# Patient Record
Sex: Male | Born: 2005 | Race: White | Hispanic: No | Marital: Single | Smoking: Never smoker
Health system: Southern US, Community
[De-identification: ages and names within clinical notes are randomized; demographics above are authoritative.]

## PROBLEM LIST (undated history)

## (undated) DIAGNOSIS — M4310 Spondylolisthesis, site unspecified: Secondary | ICD-10-CM

## (undated) DIAGNOSIS — F32A Depression, unspecified: Secondary | ICD-10-CM

## (undated) DIAGNOSIS — R55 Syncope and collapse: Secondary | ICD-10-CM

## (undated) DIAGNOSIS — R12 Heartburn: Secondary | ICD-10-CM

## (undated) DIAGNOSIS — Q796 Ehlers-Danlos syndrome, unspecified: Secondary | ICD-10-CM

## (undated) DIAGNOSIS — K219 Gastro-esophageal reflux disease without esophagitis: Secondary | ICD-10-CM

## (undated) DIAGNOSIS — K589 Irritable bowel syndrome without diarrhea: Secondary | ICD-10-CM

## (undated) DIAGNOSIS — Q233 Congenital mitral insufficiency: Secondary | ICD-10-CM

## (undated) HISTORY — DX: Gastro-esophageal reflux disease without esophagitis: K21.9

## (undated) HISTORY — DX: Heartburn: R12

## (undated) HISTORY — DX: Syncope and collapse: R55

## (undated) HISTORY — DX: Congenital mitral insufficiency: Q23.3

## (undated) HISTORY — DX: Depression, unspecified: F32.A

## (undated) HISTORY — DX: Irritable bowel syndrome, unspecified: K58.9

## (undated) HISTORY — PX: TRANSTHORACIC ECHOCARDIOGRAM: SHX275

## (undated) HISTORY — DX: Ehlers-Danlos syndrome, unspecified: Q79.60

## (undated) HISTORY — DX: Spondylolisthesis, site unspecified: M43.10

---
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
## 2023-02-04 ENCOUNTER — Encounter: Payer: Self-pay | Admitting: Family Medicine

## 2023-02-05 ENCOUNTER — Encounter: Payer: Self-pay | Admitting: Family Medicine

## 2023-02-05 ENCOUNTER — Ambulatory Visit: Payer: Self-pay | Admitting: Family Medicine

## 2023-02-05 VITALS — BP 98/78 | HR 72 | Ht 72.5 in | Wt 164.6 lb

## 2023-02-05 DIAGNOSIS — Q7961 Classical Ehlers-Danlos syndrome: Secondary | ICD-10-CM

## 2023-02-05 DIAGNOSIS — Z0000 Encounter for general adult medical examination without abnormal findings: Secondary | ICD-10-CM | POA: Diagnosis not present

## 2023-02-05 DIAGNOSIS — Z23 Encounter for immunization: Secondary | ICD-10-CM | POA: Diagnosis not present

## 2023-02-05 DIAGNOSIS — I340 Nonrheumatic mitral (valve) insufficiency: Secondary | ICD-10-CM

## 2023-02-05 NOTE — Progress Notes (Signed)
Office Note 02/05/2023  CC:  Chief Complaint  Patient presents with   Establish Care    No further questions/concerns.     HPI:  Ralph Contreras is a 17 y.o.  male who is here to establish care, health maintenance exam. Patient's most recent primary MD: [facility] records were reviewed prior to or during today's visit.  Ralph Contreras has no acute concerns.  Past Medical History:  Diagnosis Date   Congenital mitral regurgitation    Depression    Ehlers-Danlos disease    GERD (gastroesophageal reflux disease)    Irritable bowel syndrome (IBS)    w/constip   Syncope and collapse    2020    Past Surgical History:  Procedure Laterality Date   ESOPHAGOGASTRODUODENOSCOPY  09/10/2018   nl   TRANSTHORACIC ECHOCARDIOGRAM     10/2019 mild MR, stable    Family History  Problem Relation Age of Onset   Arthritis Mother    Miscarriages / [place] Mother    Hypertension Maternal Grandmother    Hyperlipidemia Maternal Grandmother    Stroke Maternal Grandmother    Arthritis Maternal Grandmother    Cancer Maternal Grandmother    Miscarriages / Stillbirths Maternal Grandmother    Hypertension Maternal Grandfather    Hyperlipidemia Maternal Grandfather    Arthritis Maternal Grandfather    Heart attack Maternal Grandfather    Hypertension Paternal Grandmother    Cancer Paternal Grandmother    Hypertension Paternal Grandfather    Heart attack Paternal Grandfather    Early death Paternal Grandfather     Social History   Socioeconomic History   Marital status: Single    Spouse name: Not on file   Number of children: Not on file   Years of education: Not on file   Highest education level: Not on file  Occupational History   Not on file  Tobacco Use   Smoking status: Never   Smokeless tobacco: Not on file  Substance and Sexual Activity   Alcohol use: No   Drug use: No   Sexual activity: Never  Other Topics Concern   Not on file  Social  History Narrative   As of 2024 he is attending [employer] is a business major.   Occup: Sales executive   No Tob/alc/drugs   Social Determinants of Corporate investment banker Strain: Not on file  Food Insecurity: Not on file  Transportation Needs: Not on file  Physical Activity: Not on file  Stress: Not on file  Social Connections: Unknown (08/19/2021)   [facility]   Social Network    Social Network: Not on file  Intimate Partner Violence: Unknown (07/11/2021)   Received from [facility]   HITS    Physically Hurt: Not on file    Insult or Talk Down To: Not on file    Threaten Physical Harm: Not on file    Scream or Curse: Not on file    Outpatient Encounter Medications as of 02/05/2023  Medication Sig   PANTOPRAZOLE SODIUM PO Take by mouth.   Pediatric Multivit-Minerals-C (CHILDRENS CHEW VIT/MINERALS PO) Take 1 tablet by mouth daily.     Probiotic Product (PROBIOTIC DAILY PO) Take by mouth.   [DISCONTINUED] Polyethylene Glycol 3350 ([employer] PURELAX PO) Take by mouth.   No facility-administered encounter medications on file as of 02/05/2023.    Allergies  Allergen Reactions   Bee Pollen Anaphylaxis   Chapstick Swelling   Other     Bee wax  Pineapple     Review of Systems  Constitutional:  Negative for appetite change, chills, fatigue and fever.  HENT:  Negative for congestion, dental problem, ear pain and sore throat.   Eyes:  Negative for discharge, redness and visual disturbance.  Respiratory:  Negative for cough, chest tightness, shortness of breath and wheezing.   Cardiovascular:  Negative for chest pain, palpitations and leg swelling.  Gastrointestinal:  Negative for abdominal pain, blood in stool, diarrhea, nausea and vomiting.  Genitourinary:  Negative for difficulty urinating, dysuria, flank pain, frequency, hematuria and urgency.  Musculoskeletal:  Positive for arthralgias (knees, low back, shoulders chronic). Negative for back pain, joint swelling,  myalgias and neck stiffness.  Skin:  Negative for pallor and rash.  Neurological:  Negative for dizziness, speech difficulty, weakness and headaches.  Hematological:  Negative for adenopathy. Does not bruise/bleed easily.  Psychiatric/Behavioral:  Negative for confusion and sleep disturbance. The patient is not nervous/anxious.     PE; Blood pressure 98/78, pulse 72, height 6' 0.5" (1.842 m), weight 164 lb 9.6 oz (74.7 kg), SpO2 96%. Body mass index is 22.02 kg/m.  Physical Exam  Gen: Alert, well appearing.  Patient is oriented to person, place, time, and situation. AFFECT: pleasant, lucid thought and speech. ENT: Ears: EACs clear, normal epithelium.  TMs with good light reflex and landmarks bilaterally.  Eyes: no injection, icteris, swelling, or exudate.  EOMI, PERRLA. Nose: no drainage or turbinate edema/swelling.  No injection or focal lesion.  Mouth: lips without lesion/swelling.  Oral mucosa pink and moist.  Dentition intact and without obvious caries or gingival swelling.  Oropharynx without erythema, exudate, or swelling.  Neck: supple/nontender.  No LAD, mass, or TM.  Carotid pulses 2+ bilaterally, without bruits. CV: RRR, no m/r/g.   LUNGS: CTA bilat, nonlabored resps, good aeration in all lung fields. ABD: soft, NT, ND, BS normal.  No hepatospenomegaly or mass.  No bruits. EXT: no clubbing, cyanosis, or edema.  Musculoskeletal: no joint swelling, erythema, warmth, or tenderness.  ROM of all joints intact. Skin -no rashes, jaundice, or pallor. He has scattered hypertrophic scars, particularly on the pretibial regions.  Pertinent labs:  Last CBC Lab Results  Component Value Date   WBC 4.6 09/26/2020   HGB 15.0 09/26/2020   HCT 44 09/26/2020   PLT 165 09/26/2020   Last lipids Lab Results  Component Value Date   CHOL 127 09/26/2020   HDL 31 (A) 09/26/2020   LDLCALC 73 09/26/2020   TRIG 118 09/26/2020   ASSESSMENT AND PLAN:   New patient, establishing care.  1.   Health maintenance exam: Reviewed age and gender appropriate health maintenance issues (prudent diet, regular exercise, health risks of tobacco and excessive alcohol, use of seatbelts, fire alarms in home, use of sunscreen).  Also reviewed age and gender appropriate health screening as well as vaccine recommendations. Vaccines: Gardisil #1 today.  He declined flu vaccine today. Labs: None today.  Consider repeat lipid and glucose screening in 2027.  #2 Carylon Perches Danlos syndrome. Stable arthralgias.  He takes Aleve about 3 to 4 days a week typically. History of mild mitral valve regurgitation.  Last echo 2021 all stable. No murmur today. No physical/exercise limitation.  An After Visit Summary was printed and given to the patient.  Return in about 1 year (around 02/05/2024) for CPE 1 yr.  Return in 2 mo for Gardisil #2.  Signed:  Santiago Bumpers, MD           02/05/2023

## 2023-02-05 NOTE — Patient Instructions (Signed)
It was nice to meet you!

## 2023-02-11 ENCOUNTER — Ambulatory Visit (INDEPENDENT_AMBULATORY_CARE_PROVIDER_SITE_OTHER): Payer: No Typology Code available for payment source | Admitting: Otolaryngology

## 2023-02-11 ENCOUNTER — Encounter (INDEPENDENT_AMBULATORY_CARE_PROVIDER_SITE_OTHER): Payer: Self-pay

## 2023-02-11 VITALS — Ht 72.0 in | Wt 165.0 lb

## 2023-02-11 DIAGNOSIS — H6123 Impacted cerumen, bilateral: Secondary | ICD-10-CM | POA: Diagnosis not present

## 2023-02-11 DIAGNOSIS — H9203 Otalgia, bilateral: Secondary | ICD-10-CM

## 2023-02-14 DIAGNOSIS — H6123 Impacted cerumen, bilateral: Secondary | ICD-10-CM | POA: Insufficient documentation

## 2023-02-14 DIAGNOSIS — H9203 Otalgia, bilateral: Secondary | ICD-10-CM | POA: Insufficient documentation

## 2023-02-14 NOTE — Progress Notes (Signed)
Patient ID: Ralph Contreras, male   DOB: 25-Mar-2006, 17 y.o.   MRN: [id number]  CC: Intermittent bilateral ear pain  HPI:  Ralph Contreras is a 17 y.o. male who presents today complaining of intermittent bilateral otalgia for the past month.  He also complains of muffled hearing and occasional sensitivity to loud noises.  He was diagnosed with left otitis media 1 week ago.  He was treated with oral antibiotic.  He has no previous ENT surgery.  He denies any otorrhea or vertigo.  Past Medical History:  Diagnosis Date   Congenital mitral regurgitation    Depression    Ehlers-Danlos disease    GERD (gastroesophageal reflux disease)    Irritable bowel syndrome (IBS)    w/constip   Syncope and collapse    2020    Past Surgical History:  Procedure Laterality Date   ESOPHAGOGASTRODUODENOSCOPY  09/10/2018   nl   TRANSTHORACIC ECHOCARDIOGRAM     10/2019 mild MR, stable    Family History  Problem Relation Age of Onset   Arthritis Mother    Miscarriages / [place] Mother    Hypertension Maternal Grandmother    Hyperlipidemia Maternal Grandmother    Stroke Maternal Grandmother    Arthritis Maternal Grandmother    Cancer Maternal Grandmother    Miscarriages / Stillbirths Maternal Grandmother    Hypertension Maternal Grandfather    Hyperlipidemia Maternal Grandfather    Arthritis Maternal Grandfather    Heart attack Maternal Grandfather    Hypertension Paternal Grandmother    Cancer Paternal Grandmother    Hypertension Paternal Grandfather    Heart attack Paternal Grandfather    Early death Paternal Grandfather     Social History:  reports that he has never smoked. He does not have any smokeless tobacco history on file. He reports that he does not drink alcohol and does not use drugs.  Allergies:  Allergies  Allergen Reactions   Bee Pollen Anaphylaxis   Chapstick Swelling   Other     Bee wax   Pineapple     Prior to Admission medications   Medication Sig Start  Date End Date Taking? Authorizing Provider  Pediatric Multivit-Minerals-C (CHILDRENS CHEW VIT/MINERALS PO) Take 1 tablet by mouth daily.     Yes [provider]  Probiotic Product (PROBIOTIC DAILY PO) Take by mouth.   Yes [provider]  PANTOPRAZOLE SODIUM PO Take by mouth. Patient not taking: Reported on 02/11/2023    [provider]   Height 6' (1.829 m), weight 165 lb (74.8 kg). Exam: General: Communicates without difficulty, well nourished, no acute distress. Head: Normocephalic, no evidence injury, no tenderness, facial buttresses intact without stepoff. Face/sinus: No tenderness to palpation and percussion. Facial movement is normal and symmetric. Eyes: PERRL, EOMI. No scleral icterus, conjunctivae clear. Neuro: CN II exam reveals vision grossly intact.  No nystagmus at any point of gaze. Ears: Auricles well formed without lesions. EAC: Bilateral cerumen impaction.  Under the operating microscope, the cerumen is carefully removed with a combination of cerumen currette, alligator forceps, and suction catheters.  After the cerumen is removed, the TMs are noted to be normal.  Nose: External evaluation reveals normal support and skin without lesions.  Dorsum is intact.  Anterior rhinoscopy reveals congested mucosa over anterior aspect of inferior turbinates and intact septum.  No purulence noted. Oral:  Oral cavity and oropharynx are intact, symmetric, without erythema or edema.  Mucosa is moist without lesions. Neck: Full range of motion without pain.  There  is no significant lymphadenopathy.  No masses palpable.  Thyroid bed within normal limits to palpation.  Parotid glands and submandibular glands equal bilaterally without mass.  Trachea is midline. Neuro:  CN 2-12 grossly intact.   Assessment: 1.  Bilateral cerumen impaction.  After the disimpaction procedure, his ear canals, tympanic membranes, and middle ear spaces are all normal. 2.  No middle ear effusion or  infection is noted today.  His recent left otitis media has resolved. 3.  Referred otalgia, possibly a result of musculoskeletal causes.  Plan: 1.  Otomicroscopy with bilateral cerumen disimpaction. 2.  The physical exam findings are reviewed with the patient. 3.  He is reassured that no infection is noted today. 4.  NSAID as needed to treat his referred otalgia. 5.  The patient is encouraged to call with with any questions or concerns.  Ralph Contreras 02/14/2023, 8:06 PM

## 2023-03-02 ENCOUNTER — Ambulatory Visit (INDEPENDENT_AMBULATORY_CARE_PROVIDER_SITE_OTHER): Payer: No Typology Code available for payment source

## 2023-03-26 ENCOUNTER — Ambulatory Visit (INDEPENDENT_AMBULATORY_CARE_PROVIDER_SITE_OTHER): Payer: No Typology Code available for payment source

## 2023-04-07 ENCOUNTER — Ambulatory Visit (INDEPENDENT_AMBULATORY_CARE_PROVIDER_SITE_OTHER): Payer: No Typology Code available for payment source

## 2023-04-07 DIAGNOSIS — Z23 Encounter for immunization: Secondary | ICD-10-CM

## 2024-02-05 ENCOUNTER — Ambulatory Visit (INDEPENDENT_AMBULATORY_CARE_PROVIDER_SITE_OTHER): Admitting: Family Medicine

## 2024-02-05 ENCOUNTER — Ambulatory Visit: Payer: Self-pay

## 2024-02-05 ENCOUNTER — Encounter: Payer: Self-pay | Admitting: Family Medicine

## 2024-02-05 VITALS — BP 102/62 | HR 84 | Temp 97.7°F | Resp 97 | Ht 72.0 in | Wt 165.8 lb

## 2024-02-05 DIAGNOSIS — R1030 Lower abdominal pain, unspecified: Secondary | ICD-10-CM

## 2024-02-05 DIAGNOSIS — M357 Hypermobility syndrome: Secondary | ICD-10-CM

## 2024-02-05 LAB — URINALYSIS, ROUTINE W REFLEX MICROSCOPIC
Bilirubin Urine: NEGATIVE
Hgb urine dipstick: NEGATIVE
Ketones, ur: NEGATIVE
Leukocytes,Ua: NEGATIVE
Nitrite: NEGATIVE
RBC / HPF: NONE SEEN (ref 0–?)
Specific Gravity, Urine: 1.015 (ref 1.000–1.030)
Total Protein, Urine: NEGATIVE
Urine Glucose: NEGATIVE
Urobilinogen, UA: 0.2 (ref 0.0–1.0)
WBC, UA: NONE SEEN (ref 0–?)
pH: 6 (ref 5.0–8.0)

## 2024-02-05 MED ORDER — DICLOFENAC SODIUM 75 MG PO TBEC: 75.0000 mg | DELAYED_RELEASE_TABLET | Freq: Two times a day (BID) | ORAL | 0 refills | Status: AC

## 2024-02-05 MED ORDER — NITROFURANTOIN MONOHYD MACRO 100 MG PO CAPS: 100.0000 mg | ORAL_CAPSULE | Freq: Two times a day (BID) | ORAL | 0 refills | Status: AC

## 2024-02-05 NOTE — Telephone Encounter (Signed)
 FYI Only or Action Required?: Action required by provider: He has his physical scheduled for Monday but wanting to be seen today for abdominal pain. Did give the option of Urgent Care but patient would like to speak to office. Requesting a call back.  Patient was last seen in primary care on 02/05/2023 by McGowen, Aleene DEL, MD.  Called Nurse Triage reporting Abdominal Pain.  Symptoms began 2 to 3 days ago.  Interventions attempted: Rest, hydration, or home remedies.  Symptoms are: unchanged.  Triage Disposition: See Physician Within 24 Hours  Patient/caregiver understands and will follow disposition?: No, wishes to speak with PCP  Reports lower abdominal pain for the last 2 to 3 days. Pain is intermittent with no other symptoms. Patient is scheduled for his physical on Monday. Asking if he can be seen today if possible. Would like a call back.  Copied from CRM #[id number]. Topic: Clinical - Red Word Triage >> Feb 05, 2024  8:41 AM Victoria A wrote: [facility] that prompted transfer to Nurse Triage: Patient is having lower abdominal pain started four days ago. 1-10 pain is 5 Reason for Disposition  [1] MODERATE pain (e.g., interferes with normal activities) AND [2] pain comes and goes (cramps) AND [3] present > 24 hours  (Exception: Pain with Vomiting or Diarrhea - see that Guideline.)  Answer Assessment - Initial Assessment Questions 1. LOCATION: Where does it hurt?      Lower abdominal pain 2. RADIATION: Does the pain shoot anywhere else? (e.g., chest, back)     No radiation 3. ONSET: When did the pain begin? (Minutes, hours or days ago)      Started 2 to 3 days ago 4. SUDDEN: Gradual or sudden onset?     gradually 5. PATTERN Does the pain come and go, or is it constant?     Comes and goes-worse at night 6. SEVERITY: How bad is the pain?  (e.g., Scale 1-10; mild, moderate, or severe)     5 out of 10 7. RECURRENT SYMPTOM: Have you ever had this type of stomach pain  before? If Yes, ask: When was the last time? and What happened that time?      no 8. CAUSE: What do you think is causing the stomach pain? (e.g., gallstones, recent abdominal surgery)     Unsure of what is causing the pain 9. RELIEVING/AGGRAVATING FACTORS: What makes it better or worse? (e.g., antacids, bending or twisting motion, bowel movement)     Sitting down makes the pain worse 10. OTHER SYMPTOMS: Do you have any other symptoms? (e.g., back pain, diarrhea, fever, urination pain, vomiting)       no  Protocols used: Abdominal Pain - Male-A-AH

## 2024-02-05 NOTE — Patient Instructions (Addendum)
 It was very nice to see you today!  VISIT SUMMARY: You came in today with lower abdominal and groin pain that has been worsening over the past few days. We discussed possible causes and next steps for managing your symptoms.  YOUR PLAN: LOWER ABDOMINAL AND GROIN PAIN: You have been experiencing pain in your lower abdomen and groin area, which radiates to your testicles and penis. There are no signs of a urinary tract infection (UTI) or other systemic issues. -We will obtain a urine sample to rule out a UTI. -You are prescribed anti-inflammatory medication for 1-2 weeks. -If you develop symptoms of a UTI, you have a prescription for macrobid to use as a backup. -If your symptoms persist or worsen, we may consider a CT scan.  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 -Monitor your symptoms closely and be aware of the potential for muscle strain due to EDS.  Return if symptoms worsen or fail to improve.   Take care, Dr Kennyth  PLEASE NOTE:  If you had any lab tests, please let us  know if you have not heard back within a few days. You may see your results on mychart before we have a chance to review them but we will give you a call once they are reviewed by us .   If we ordered any referrals today, please let us  know if you have not heard from their office within the next week.   If you had any urgent prescriptions sent in today, please check with the pharmacy within an hour of our visit to make sure the prescription was transmitted appropriately.   Please try these tips to maintain a healthy lifestyle:  Eat at least 3 REAL meals and 1-2 snacks per day.  Aim for no more than 5 hours between eating.  If you eat breakfast, please do so within one hour of getting up.   Each meal should contain half fruits/vegetables, one quarter protein, and one quarter  carbs (no bigger than a computer mouse)  Cut down on sweet beverages. This includes juice, soda, and sweet tea.   Drink at least 1 glass of water with each meal and aim for at least 8 glasses per day  Exercise at least 150 minutes every week.

## 2024-02-05 NOTE — Telephone Encounter (Signed)
 Spoke with patient regarding results/recommendations.  Pt scheduled in different office

## 2024-02-05 NOTE — Progress Notes (Signed)
   Ralph Contreras is a 18 y.o. male who presents today for an office visit.  Assessment/Plan:  Lower abdominal pain No red flags.  Overall reassuring  abdominal exam.  Differential = includes UTI, hernia, and muscular strain.  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  Patient deferred GU exam today - cannot definitively rule out hernia though given his overall well appearance and reassuring abdominal exam, it is unlikely he has incarcerated hernia or other urgent intra-abdominal process at this time.  We will check UA and urine culture to definitively rule out UTI and will start meloxicam 75 mg twice daily for the next 1 to 2 weeks for possible muscle strain.  Will send a pocket prescription for Macrobid with instruction to not start unless he develops any other signs or symptoms of a UTI including dysuria.  We did discuss checking a CT scan which would definitively rule out hernia as well as explore other etiologies however we will hold off on this for now depending on results of above workup.  He will follow-up with us  early next week.  We discussed reasons to return to care earlier.      Subjective:  HPI:  Discussed the use of AI scribe software for clinical note transcription with the patient, who gave verbal consent to proceed.  History of Present Illness Ralph Contreras is a 18 year old male with Ehlers-Danlos Syndrome who presents with lower abdominal pain.  He has been experiencing constant lower abdominal pain radiating in the groin area for the past three to four days, which intensifies at night and has progressively worsened. Initially localized to the groin, the pain has moved slightly upward to the lower stomach and radiates to the testicles and penis. Movement, such as bending or squatting, exacerbates the pain, and he describes a sensation of coldness in the affected  area.  No urinary issues, changes in urination frequency, fever, chills, body aches, nausea, vomiting, constipation, diarrhea, or hematuria. He maintains normal bowel movements and has not experienced any changes in diet, routine, or physical activity. He has not been sexually active recently and has not sustained any injuries.  He has been taking ibuprofen with minimal relief. He has a history of Ehlers-Danlos Syndrome.         Objective:  Physical Exam: BP 102/62   Pulse 84   Temp 97.7 F (36.5 C) (Temporal)   Resp (!) 97   Ht 6' (1.829 m)   Wt 165 lb 12.8 oz (75.2 kg)   BMI 22.49 kg/m   Gen: No acute distress, resting comfortably CV: Regular rate and rhythm with no murmurs appreciated Pulm: Normal work of breathing, clear to auscultation bilaterally with no crackles, wheezes, or rhonchi Abdomen: No deformities, bowel sounds present, soft, nontender, nondistended.  No appreciable masses.  No rebound or guarding. Neuro: Grossly normal, moves all extremities Psych: Normal affect and thought content      Lynel Forester M. Kennyth, MD 02/05/2024 12:38 PM

## 2024-02-05 NOTE — Telephone Encounter (Signed)
 Spoke with patient, advised we could get him an appt with another provider, appt scheduled

## 2024-02-06 LAB — URINE CULTURE
MICRO NUMBER:: 17175619
Result:: NO GROWTH
SPECIMEN QUALITY:: ADEQUATE

## 2024-02-08 ENCOUNTER — Ambulatory Visit (INDEPENDENT_AMBULATORY_CARE_PROVIDER_SITE_OTHER): Payer: No Typology Code available for payment source | Admitting: Family Medicine

## 2024-02-08 ENCOUNTER — Ambulatory Visit (HOSPITAL_BASED_OUTPATIENT_CLINIC_OR_DEPARTMENT_OTHER)
Admission: RE | Admit: 2024-02-08 | Discharge: 2024-02-08 | Disposition: A | Discharge status: Home / Self Care | Source: Ambulatory Visit | Attending: Family Medicine | Admitting: Family Medicine

## 2024-02-08 ENCOUNTER — Encounter: Payer: Self-pay | Admitting: Family Medicine

## 2024-02-08 VITALS — BP 109/68 | HR 61 | Temp 98.0°F | Ht 73.0 in | Wt 166.2 lb

## 2024-02-08 DIAGNOSIS — Z0000 Encounter for general adult medical examination without abnormal findings: Secondary | ICD-10-CM | POA: Diagnosis not present

## 2024-02-08 DIAGNOSIS — R1030 Lower abdominal pain, unspecified: Secondary | ICD-10-CM | POA: Insufficient documentation

## 2024-02-08 MED ORDER — IOHEXOL 300 MG/ML  SOLN
100.0000 mL | Freq: Once | INTRAMUSCULAR | Status: AC | PRN
  Administered 2024-02-08: 100 mL via INTRAVENOUS

## 2024-02-08 NOTE — Progress Notes (Signed)
 Office Note 02/08/2024  CC:  Chief Complaint  Patient presents with   Annual Exam   HPI:  Patient is a 18 y.o. male who is here for annual health maintenance exam.  Onset 1 week ago of lower abdominal pain, more localized toward the groin.  Diffuse, not localized towards 1 side or another.  No burning with urination or voiding problems.  No constipation or diarrhea or blood in stool, no melena. No fever.  He says the pain is gradually worsened, affecting his work and ability to concentrate at all.  No nausea or vomiting.  Says the pain does extend down into the testicular area bilaterally.  He was seen 3 days ago by Dr. Kennyth for same concern and UA and culture were negative.  He was prescribed diclofenac, says this is not helping any.  He was prescribed nitrofurantoin to take if he started having burning with urination.  He is currently working at [employer] [place] [address] [place]. He is attending [employer], second year.  Past Medical History:  Diagnosis Date   Congenital mitral regurgitation    Depression    Ehlers-Danlos disease    GERD (gastroesophageal reflux disease)    Irritable bowel syndrome (IBS)    w/constip   Syncope and collapse    2020    Past Surgical History:  Procedure Laterality Date   ESOPHAGOGASTRODUODENOSCOPY  09/10/2018   nl   TRANSTHORACIC ECHOCARDIOGRAM     10/2019 mild MR, stable    Family History  Problem Relation Age of Onset   Arthritis Mother    Miscarriages / Stillbirths Mother    Hypertension Maternal Grandmother    Hyperlipidemia Maternal Grandmother    Stroke Maternal Grandmother    Arthritis Maternal Grandmother    Cancer Maternal Grandmother    Miscarriages / Stillbirths Maternal Grandmother    Hypertension Maternal Grandfather    Hyperlipidemia Maternal Grandfather    Arthritis Maternal Grandfather    Heart attack Maternal Grandfather    Hypertension Paternal Grandmother    Cancer Paternal Grandmother    Hypertension Paternal  Grandfather    Heart attack Paternal Grandfather    Early death Paternal Grandfather     Social History   Socioeconomic History   Marital status: Single    Spouse name: Not on file   Number of children: Not on file   Years of education: Not on file   Highest education level: Not on file  Occupational History   Not on file  Tobacco Use   Smoking status: Never   Smokeless tobacco: Not on file  Substance and Sexual Activity   Alcohol use: No   Drug use: No   Sexual activity: Never  Other Topics Concern   Not on file  Social History Narrative   As of 2024 he is attending [employer] is a business major.   Occup: sales executive   No Tob/alc/drugs   Social Drivers of Corporate Investment Banker Strain: Not on file  Food Insecurity: Not on file  Transportation Needs: Not on file  Physical Activity: Not on file  Stress: Not on file  Social Connections: Unknown (08/19/2021)   Received from [facility]   Social Network    Social Network: Not on file  Intimate Partner Violence: Unknown (07/11/2021)   Received from [facility]   HITS    Physically Hurt: Not on file    Insult or Talk Down To: Not on file    Threaten Physical Harm: Not on file  Scream or Curse: Not on file    Outpatient Medications Prior to Visit  Medication Sig Dispense Refill   diclofenac (VOLTAREN) 75 MG EC tablet Take 1 tablet (75 mg total) by mouth 2 (two) times daily. 30 tablet 0   nitrofurantoin, macrocrystal-monohydrate, (MACROBID) 100 MG capsule Take 1 capsule (100 mg total) by mouth 2 (two) times daily. (Patient not taking: Reported on 02/08/2024) 14 capsule 0   PANTOPRAZOLE SODIUM PO Take by mouth. (Patient not taking: Reported on 02/08/2024)     Pediatric Multivit-Minerals-C (CHILDRENS CHEW VIT/MINERALS PO) Take 1 tablet by mouth daily.       Probiotic Product (PROBIOTIC DAILY PO) Take by mouth.     No facility-administered medications prior to visit.    Allergies  Allergen Reactions   Bee Pollen  Anaphylaxis   Chapstick Swelling   Other     Bee wax   Pineapple     Review of Systems  Constitutional:  Negative for appetite change, chills, fatigue and fever.  HENT:  Negative for congestion, dental problem, ear pain and sore throat.   Eyes:  Negative for discharge, redness and visual disturbance.  Respiratory:  Negative for cough, chest tightness, shortness of breath and wheezing.   Cardiovascular:  Negative for chest pain, palpitations and leg swelling.  Gastrointestinal:  Positive for abdominal pain. Negative for blood in stool, diarrhea, nausea and vomiting.  Genitourinary:  Negative for difficulty urinating, dysuria, flank pain, frequency, hematuria and urgency.  Musculoskeletal:  Negative for arthralgias, back pain, joint swelling, myalgias and neck stiffness.  Skin:  Negative for pallor and rash.  Neurological:  Negative for dizziness, speech difficulty, weakness and headaches.  Hematological:  Negative for adenopathy. Does not bruise/bleed easily.  Psychiatric/Behavioral:  Negative for confusion and sleep disturbance. The patient is not nervous/anxious.     PE;    02/08/2024    8:08 AM 02/05/2024   11:45 AM 02/11/2023    3:39 PM  Vitals with BMI  Height 6' 1 6' 0 6' 0  Weight 166 lbs 3 oz 165 lbs 13 oz 165 lbs  BMI 21.93 22.48 22.37  Systolic 109 102   Diastolic 68 62   Pulse 61 84    Gen: Alert, well appearing.  Patient is oriented to person, place, time, and situation. AFFECT: pleasant, lucid thought and speech. ENT: Ears: EACs clear, normal epithelium.  TMs with good light reflex and landmarks bilaterally.  Eyes: no injection, icteris, swelling, or exudate.  EOMI, PERRLA. Nose: no drainage or turbinate edema/swelling.  No injection or focal lesion.  Mouth: lips without lesion/swelling.  Oral mucosa pink and moist.  Dentition intact and without obvious caries or gingival swelling.  Oropharynx without erythema, exudate, or swelling.  Neck: supple/nontender.  No  LAD, mass, or TM.  Carotid pulses 2+ bilaterally, without bruits. CV: RRR, no m/r/g.   LUNGS: CTA bilat, nonlabored resps, good aeration in all lung fields. ABD: soft, NT, ND, BS normal.  No hepatospenomegaly or mass.  No bruits. GU: No hernia, scrotum and testicles are normal, no tenderness to palpation.  No lymphadenopathy. EXT: no clubbing, cyanosis, or edema.  Musculoskeletal: no joint swelling, erythema, warmth, or tenderness.  Skin - no sores or suspicious lesions or rashes or color changes  Pertinent labs:  Lab Results  Component Value Date   WBC 4.6 09/26/2020   HGB 15.0 09/26/2020   HCT 44 09/26/2020   PLT 165 09/26/2020   Lab Results  Component Value Date   CHOL 127 09/26/2020  Lab Results  Component Value Date   HDL 31 (A) 09/26/2020   Lab Results  Component Value Date   LDLCALC 73 09/26/2020   Lab Results  Component Value Date   TRIG 118 09/26/2020   ASSESSMENT AND PLAN:   #1 health maintenance exam: Reviewed age and gender appropriate health maintenance issues (prudent diet, regular exercise, health risks of tobacco and excessive alcohol, use of seatbelts, fire alarms in home, use of sunscreen).  Also reviewed age and gender appropriate health screening as well as vaccine recommendations. Vaccines: He declined flu vaccine today. Labs: None today.  Consider repeat lipid and glucose screening in 2027.  #2 acute lower abdominal/groin pain.  Unknown etiology.  Progressing in intensity. Will check CBC with differential, c-Met, sed rate, CRP, and CT abdomen pelvis with contrast.  Okay to continue diclofenac 75 mg twice daily and I will see him again in 1 week, earlier if worsening or new symptoms.  An After Visit Summary was printed and given to the patient.  FOLLOW UP:  No follow-ups on file.  Signed:  Gerlene Hockey, MD           02/08/2024

## 2024-02-09 ENCOUNTER — Encounter: Payer: Self-pay | Admitting: Family Medicine

## 2024-02-09 ENCOUNTER — Ambulatory Visit: Payer: Self-pay | Admitting: Family Medicine

## 2024-02-09 LAB — COMPREHENSIVE METABOLIC PANEL WITH GFR
AG Ratio: 2.7 (calc) — ABNORMAL HIGH (ref 1.0–2.5)
ALT: 13 U/L (ref 9–46)
AST: 15 U/L (ref 10–40)
Albumin: 4.6 g/dL (ref 3.6–5.1)
Alkaline phosphatase (APISO): 83 U/L (ref 36–130)
BUN: 16 mg/dL (ref 7–25)
CO2: 28 mmol/L (ref 20–32)
Calcium: 9.6 mg/dL (ref 8.6–10.3)
Chloride: 103 mmol/L (ref 98–110)
Creat: 0.78 mg/dL (ref 0.60–1.24)
Globulin: 1.7 g/dL — ABNORMAL LOW (ref 1.9–3.7)
Glucose, Bld: 89 mg/dL (ref 65–99)
Potassium: 4.3 mmol/L (ref 3.5–5.3)
Sodium: 139 mmol/L (ref 135–146)
Total Bilirubin: 0.7 mg/dL (ref 0.2–1.2)
Total Protein: 6.3 g/dL (ref 6.1–8.1)
eGFR: 131 mL/min/1.73m2 (ref >=60)

## 2024-02-09 LAB — CBC WITH DIFFERENTIAL/PLATELET
Absolute Lymphocytes: 2309 {cells}/uL (ref 850–3900)
Absolute Monocytes: 747 {cells}/uL (ref 200–950)
Basophils Absolute: 68 {cells}/uL (ref 0–200)
Basophils Relative: 1.2 %
Eosinophils Absolute: 257 {cells}/uL (ref 15–500)
Eosinophils Relative: 4.5 %
HCT: 44.2 % (ref 38.5–50.0)
Hemoglobin: 14.8 g/dL (ref 13.2–17.1)
MCH: 29.9 pg (ref 27.0–33.0)
MCHC: 33.5 g/dL (ref 32.0–36.0)
MCV: 89.3 fL (ref 80.0–100.0)
MPV: 10.4 fL (ref 7.5–12.5)
Monocytes Relative: 13.1 %
Neutro Abs: 2320 {cells}/uL (ref 1500–7800)
Neutrophils Relative %: 40.7 %
Platelets: 238 Thousand/uL (ref 140–400)
RBC: 4.95 Million/uL (ref 4.20–5.80)
RDW: 12.8 % (ref 11.0–15.0)
Total Lymphocyte: 40.5 %
WBC: 5.7 Thousand/uL (ref 3.8–10.8)

## 2024-02-09 LAB — SEDIMENTATION RATE: Sed Rate: 2 mm/h (ref 0–15)

## 2024-02-09 LAB — C-REACTIVE PROTEIN: CRP: <3 mg/L (ref <8.0)

## 2024-02-09 NOTE — Progress Notes (Signed)
 His urine culture is negative.  He does not have a UTI.  He should let us  or his PCP know if symptoms are not improving.

## 2024-02-15 ENCOUNTER — Ambulatory Visit (INDEPENDENT_AMBULATORY_CARE_PROVIDER_SITE_OTHER): Admitting: Family Medicine

## 2024-02-15 ENCOUNTER — Encounter: Payer: Self-pay | Admitting: Family Medicine

## 2024-02-15 VITALS — BP 107/65 | HR 70 | Temp 98.3°F | Ht 73.0 in | Wt 168.6 lb

## 2024-02-15 DIAGNOSIS — M898X8 Other specified disorders of bone, other site: Secondary | ICD-10-CM | POA: Diagnosis not present

## 2024-02-15 NOTE — Progress Notes (Signed)
 OFFICE VISIT  02/15/2024  CC: No chief complaint on file.  Patient is a 18 y.o. male who presents for 1 week follow-up groin pain. acute lower abdominal/groin pain.  Unknown etiology.  Progressing in intensity. Will check CBC with differential, c-Met, sed rate, CRP, and CT abdomen pelvis with contrast. Okay to continue diclofenac 75 mg twice daily and I will see him again in 1 week, earlier if worsening or new symptoms  INTERIM HX: Labs were all normal last visit. His CT scan showed no acute findings in the abdomen or pelvis.  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   His pain is significantly improved, nearly completely resolved.  He has no hip or back pain.  Past Medical History:  Diagnosis Date   Congenital mitral regurgitation    Depression    Ehlers-Danlos disease    GERD (gastroesophageal reflux disease)    Irritable bowel syndrome (IBS)    w/constip   Pars defect with spondylolisthesis    Bilateral L5, grade 1.  Incidentally noted on CT abdomen 02/08/2024   Syncope and collapse    2020    Past Surgical History:  Procedure Laterality Date   ESOPHAGOGASTRODUODENOSCOPY  09/10/2018   nl   TRANSTHORACIC ECHOCARDIOGRAM     10/2019 mild MR, stable    Outpatient Medications Prior to Visit  Medication Sig Dispense Refill   diclofenac (VOLTAREN) 75 MG EC tablet Take 1 tablet (75 mg total) by mouth 2 (two) times daily. 30 tablet 0   nitrofurantoin, macrocrystal-monohydrate, (MACROBID) 100 MG capsule Take 1 capsule (100 mg total) by mouth 2 (two) times daily. (Patient not taking: Reported on 02/15/2024) 14 capsule 0   No facility-administered medications prior to visit.    Allergies  Allergen Reactions   Bee Pollen Anaphylaxis   Chapstick Swelling   Other     Bee wax   Pineapple     Review of Systems As per HPI  PE:    02/15/2024   11:21 AM 02/08/2024    8:08 AM 02/05/2024   11:45 AM  Vitals with BMI  Height 6' 1 6' 1 6' 0   Weight 168 lbs 10 oz 166 lbs 3 oz 165 lbs 13 oz  BMI 22.25 21.93 22.48  Systolic 107 109 897  Diastolic 65 68 62  Pulse 70 61 84     Physical Exam Exam chaperoned by Bobbetta Degree, CMA.  General: Alert and well-appearing Affect is pleasant, thought and speech are lucid. Abdomen is soft and nontender and nondistended. Pubic region without tenderness.  Range of motion of the hips does not reproduce his pain.  LABS:  Last CBC Lab Results  Component Value Date   WBC 5.7 02/08/2024   HGB 14.8 02/08/2024   HCT 44.2 02/08/2024   MCV 89.3 02/08/2024   MCH 29.9 02/08/2024   RDW 12.8 02/08/2024   PLT 238 02/08/2024   Last metabolic panel Lab Results  Component Value Date   GLUCOSE 89 02/08/2024   NA 139 02/08/2024   K 4.3 02/08/2024   CL 103 02/08/2024   CO2 28 02/08/2024   BUN 16 02/08/2024   CREATININE 0.78 02/08/2024   EGFR 131 02/08/2024   CALCIUM 9.6 02/08/2024   PROT 6.3 02/08/2024   BILITOT 0.7 02/08/2024   AST 15 02/08/2024   ALT 13 02/08/2024   Lab Results  Component Value Date   ESRSEDRATE 2 02/08/2024   Lab Results  Component Value Date   CRP <3.0 02/08/2024  IMPRESSION AND PLAN:  Pelvic pain, 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  I would expect him to be at higher risk of this problem since he has Ehlers-Danlos syndrome. He is getting much better with time and use of Voltaren 75 mg twice daily as needed. Expectant management. Consider sports medicine or orthopedics referral in the future if this becomes more of a problem.  An After Visit Summary was printed and given to the patient.  FOLLOW UP: Return if symptoms worsen or fail to improve.  Signed:  Gerlene Hockey, MD           02/15/2024

## 2024-02-15 NOTE — Patient Instructions (Signed)
 Osteitis pubis (pubic symphysitis)
